# Patient Record
Sex: Male | Born: 1965 | Race: White | Hispanic: No | Marital: Married | Smoking: Never smoker
Health system: Southern US, Community
[De-identification: ages and names within clinical notes are randomized; demographics above are authoritative.]

## PROBLEM LIST (undated history)

## (undated) DIAGNOSIS — M1990 Unspecified osteoarthritis, unspecified site: Secondary | ICD-10-CM

## (undated) DIAGNOSIS — J45909 Unspecified asthma, uncomplicated: Secondary | ICD-10-CM

## (undated) DIAGNOSIS — N200 Calculus of kidney: Secondary | ICD-10-CM

## (undated) HISTORY — DX: Unspecified osteoarthritis, unspecified site: M19.90

## (undated) HISTORY — DX: Unspecified asthma, uncomplicated: J45.909

## (undated) HISTORY — DX: Calculus of kidney: N20.0

## (undated) HISTORY — PX: NERVE BIOPSY: SHX1015

---

## 2011-03-05 ENCOUNTER — Ambulatory Visit: Payer: Self-pay | Admitting: Internal Medicine

## 2013-07-03 ENCOUNTER — Ambulatory Visit: Payer: Self-pay | Admitting: Cardiovascular Disease

## 2013-07-18 ENCOUNTER — Encounter: Payer: Self-pay | Admitting: *Deleted

## 2013-07-19 ENCOUNTER — Ambulatory Visit (INDEPENDENT_AMBULATORY_CARE_PROVIDER_SITE_OTHER): Payer: BC Managed Care – PPO | Admitting: Cardiovascular Disease

## 2013-07-19 ENCOUNTER — Encounter: Payer: Self-pay | Admitting: Cardiovascular Disease

## 2013-07-19 ENCOUNTER — Ambulatory Visit: Payer: Self-pay | Admitting: Cardiovascular Disease

## 2013-07-19 VITALS — BP 129/86 | HR 96 | Ht 71.0 in | Wt 250.0 lb

## 2013-07-19 VITALS — BP 123/86 | HR 80 | Ht 71.5 in | Wt 250.8 lb

## 2013-07-19 DIAGNOSIS — R079 Chest pain, unspecified: Secondary | ICD-10-CM

## 2013-07-19 NOTE — Patient Instructions (Signed)
Your physician has requested that you have an exercise tolerance test. For further information please visit [email or web address]. Please also follow instruction sheet, as given.   

## 2013-07-19 NOTE — Procedures (Signed)
   Treadmill Stress test  Indication: Chest pain and palpitations  Baseline Data:  Resting EKG shows NSR with rate of 96 bpm, no significant ST or T wave changes Resting blood pressure of 129/86 mm Hg Stand bruce protocal was used.  Exercise Data:  Patient exercised for 6 min 11 sec,  Peak heart rate of 162 bpm.  This was 93 % of the maximum predicted heart rate. No symptoms of chest pain or lightheadedness were reported at peak stress or in recovery.  Peak Blood pressure recorded was 175/72 Maximal work level: 7.2 METs.  Heart rate at 3 minutes in recovery was 110 bpm. BP response: Normal HR response: Normal  EKG with Exercise: Sinus tachycardia with no significant ST changes  FINAL IMPRESSION: Normal exercise stress test. No significant EKG changes concerning for ischemia. Average exercise tolerance.

## 2013-07-19 NOTE — Patient Instructions (Signed)
Your stress test is normal.  Follow up as needed.  

## 2013-07-20 DIAGNOSIS — R079 Chest pain, unspecified: Secondary | ICD-10-CM | POA: Insufficient documentation

## 2013-07-20 NOTE — Progress Notes (Signed)
  Primary care physician: Reather LittlerLeslie Sharp FNP  HPI  This is a pleasant 48 year old male who was referred for evaluation of chest pain and palpitations. He has no previous cardiac history. He has no significant risk factors for coronary artery disease other than obesity. There is no family history of premature coronary artery disease. He does not smoke. He was under pressure recently related to being busy at work as well as being in graduate school. He was consuming excessive amounts of caffeine daily estimated between 1500-2000 mg every day. Recently while at work, had an episode of palpitations associated with substernal chest tightness. He was mildly tachycardic. EKG shows sinus tachycardia with a heart rate of 100 or 3 beats per minute. He gradually cut down on caffeine intake since then with his occlusion of symptoms.  Allergies  Allergen Reactions  . Penicillins      No current outpatient prescriptions on file prior to visit.   No current facility-administered medications on file prior to visit.     Past Medical History  Diagnosis Date  . Kidney stone   . Asthma   . OA (osteoarthritis)      Past Surgical History  Procedure Laterality Date  . Nerve biopsy       Family History  Problem Relation Age of Onset  . Cancer Father      History   Social History  . Marital Status: Married    Spouse Name: N/A    Number of Children: N/A  . Years of Education: N/A   Occupational History  . Not on file.   Social History Main Topics  . Smoking status: Never Smoker   . Smokeless tobacco: Not on file  . Alcohol Use: No  . Drug Use: No  . Sexual Activity: Not on file   Other Topics Concern  . Not on file   Social History Narrative  . No narrative on file     ROS A 10 point review of system was performed. It is negative other than that mentioned in the history of present illness.   PHYSICAL EXAM   BP 123/86  Pulse 80  Ht 5' 11.5" (1.816 m)  Wt 250 lb 12 oz  (113.739 kg)  BMI 34.49 kg/m2 Constitutional: He is oriented to person, place, and time. He appears well-developed and well-nourished. No distress.  HENT: No nasal discharge.  Head: Normocephalic and atraumatic.  Eyes: Pupils are equal and round.  No discharge. Neck: Normal range of motion. Neck supple. No JVD present. No thyromegaly present.  Cardiovascular: Normal rate, regular rhythm, normal heart sounds. Exam reveals no gallop and no friction rub. No murmur heard.  Pulmonary/Chest: Effort normal and breath sounds normal. No stridor. No respiratory distress. He has no wheezes. He has no rales. He exhibits no tenderness.  Abdominal: Soft. Bowel sounds are normal. He exhibits no distension. There is no tenderness. There is no rebound and no guarding.  Musculoskeletal: Normal range of motion. He exhibits no edema and no tenderness.  Neurological: He is alert and oriented to person, place, and time. Coordination normal.  Skin: Skin is warm and dry. No rash noted. He is not diaphoretic. No erythema. No pallor.  Psychiatric: He has a normal mood and affect. His behavior is normal. Judgment and thought content normal.       WUJ:[id number]:Sinus  Rhythm  WITHIN NORMAL LIMITS   ASSESSMENT AND PLAN

## 2013-07-20 NOTE — Assessment & Plan Note (Signed)
I suspect that symptoms of palpitations and chest tightness was related to excessive caffeine intake. Physical exam is unremarkable and baseline ECG is normal. I proceeded with a treadmill stress test which showed no evidence of ischemia. He reports no further symptoms after decreasing caffeine intake. He can followup with us as needed. I discussed with him the importance of healthy lifestyle, regular exercise and weight loss.

## 2013-07-23 ENCOUNTER — Encounter: Payer: Self-pay | Admitting: *Deleted

## 2016-04-02 ENCOUNTER — Ambulatory Visit: Payer: BC Managed Care – PPO | Admitting: Family Medicine

## 2016-04-06 ENCOUNTER — Encounter: Payer: Self-pay | Admitting: Emergency Medicine

## 2016-04-06 ENCOUNTER — Emergency Department: Payer: BC Managed Care – PPO

## 2016-04-06 ENCOUNTER — Emergency Department
Admission: EM | Admit: 2016-04-06 | Discharge: 2016-04-06 | Disposition: A | Discharge status: Home / Self Care | Payer: BC Managed Care – PPO | Attending: Emergency Medicine | Admitting: Emergency Medicine

## 2016-04-06 DIAGNOSIS — X58XXXA Exposure to other specified factors, initial encounter: Secondary | ICD-10-CM | POA: Insufficient documentation

## 2016-04-06 DIAGNOSIS — Z79899 Other long term (current) drug therapy: Secondary | ICD-10-CM | POA: Diagnosis not present

## 2016-04-06 DIAGNOSIS — J45909 Unspecified asthma, uncomplicated: Secondary | ICD-10-CM | POA: Insufficient documentation

## 2016-04-06 DIAGNOSIS — Y999 Unspecified external cause status: Secondary | ICD-10-CM | POA: Diagnosis not present

## 2016-04-06 DIAGNOSIS — Y92811 Bus as the place of occurrence of the external cause: Secondary | ICD-10-CM | POA: Diagnosis not present

## 2016-04-06 DIAGNOSIS — S299XXA Unspecified injury of thorax, initial encounter: Secondary | ICD-10-CM | POA: Diagnosis present

## 2016-04-06 DIAGNOSIS — Y939 Activity, unspecified: Secondary | ICD-10-CM | POA: Insufficient documentation

## 2016-04-06 DIAGNOSIS — R0781 Pleurodynia: Secondary | ICD-10-CM | POA: Diagnosis not present

## 2016-04-06 DIAGNOSIS — Z7982 Long term (current) use of aspirin: Secondary | ICD-10-CM | POA: Insufficient documentation
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
## 2016-04-06 NOTE — ED Notes (Signed)
See triage note. Pt c/o L mid back pain radiating towards ant L rib cage. Has been taking aleve with some relief. Exacerbated by certain movements and deep breathing. Denies SOB, denies urinary s/sx.
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
## 2016-04-27 ENCOUNTER — Encounter: Payer: Self-pay | Admitting: Family Medicine

## 2016-04-27 ENCOUNTER — Ambulatory Visit (INDEPENDENT_AMBULATORY_CARE_PROVIDER_SITE_OTHER): Payer: BC Managed Care – PPO | Admitting: Family Medicine

## 2016-04-27 VITALS — BP 122/88 | HR 92 | Temp 98.0°F | Ht 69.0 in | Wt 259.4 lb

## 2016-04-27 DIAGNOSIS — E669 Obesity, unspecified: Secondary | ICD-10-CM | POA: Insufficient documentation

## 2016-04-27 DIAGNOSIS — Z0000 Encounter for general adult medical examination without abnormal findings: Secondary | ICD-10-CM | POA: Diagnosis not present

## 2016-04-27 DIAGNOSIS — M1990 Unspecified osteoarthritis, unspecified site: Secondary | ICD-10-CM | POA: Insufficient documentation

## 2016-04-27 DIAGNOSIS — J45909 Unspecified asthma, uncomplicated: Secondary | ICD-10-CM | POA: Insufficient documentation

## 2016-04-27 NOTE — Patient Instructions (Signed)
Follow up annually or sooner if needed.  Take care  Dr. Adriana Simas    Health Maintenance, Male A healthy lifestyle and preventive care is important for your health and wellness. Ask your health care provider about what schedule of regular examinations is right for you. What should I know about weight and diet?  Eat a Healthy Diet  Eat plenty of vegetables, fruits, whole grains, low-fat dairy products, and lean protein.  Do not eat a lot of foods high in solid fats, added sugars, or salt. Maintain a Healthy Weight  Regular exercise can help you achieve or maintain a healthy weight. You should:  Do at least 150 minutes of exercise each week. The exercise should increase your heart rate and make you sweat (moderate-intensity exercise).  Do strength-training exercises at least twice a week. Watch Your Levels of Cholesterol and Blood Lipids  Have your blood tested for lipids and cholesterol every 5 years starting at 50 years of age. If you are at high risk for heart disease, you should start having your blood tested when you are 50 years old. You may need to have your cholesterol levels checked more often if:  Your lipid or cholesterol levels are high.  You are older than 50 years of age.  You are at high risk for heart disease. What should I know about cancer screening? Many types of cancers can be detected early and may often be prevented. Lung Cancer  You should be screened every year for lung cancer if:  You are a current smoker who has smoked for at least 30 years.  You are a former smoker who has quit within the past 15 years.  Talk to your health care provider about your screening options, when you should start screening, and how often you should be screened. Colorectal Cancer  Routine colorectal cancer screening usually begins at 50 years of age and should be repeated every 5-10 years until you are 50 years old. You may need to be screened more often if early forms of  precancerous polyps or small growths are found. Your health care provider may recommend screening at an earlier age if you have risk factors for colon cancer.  Your health care provider may recommend using home test kits to check for hidden blood in the stool.  A small camera at the end of a tube can be used to examine your colon (sigmoidoscopy or colonoscopy). This checks for the earliest forms of colorectal cancer. Prostate and Testicular Cancer  Depending on your age and overall health, your health care provider may do certain tests to screen for prostate and testicular cancer.  Talk to your health care provider about any symptoms or concerns you have about testicular or prostate cancer. Skin Cancer  Check your skin from head to toe regularly.  Tell your health care provider about any new moles or changes in moles, especially if:  There is a change in a mole's size, shape, or color.  You have a mole that is larger than a pencil eraser.  Always use sunscreen. Apply sunscreen liberally and repeat throughout the day.  Protect yourself by wearing long sleeves, pants, a wide-brimmed hat, and sunglasses when outside. What should I know about heart disease, diabetes, and high blood pressure?  If you are 30-64 years of age, have your blood pressure checked every 3-5 years. If you are 54 years of age or older, have your blood pressure checked every year. You should have your blood pressure measured twice-once  when you are at a hospital or clinic, and once when you are not at a hospital or clinic. Record the average of the two measurements. To check your blood pressure when you are not at a hospital or clinic, you can use:  An automated blood pressure machine at a pharmacy.  A home blood pressure monitor.  Talk to your health care provider about your target blood pressure.  If you are between 10-29 years old, ask your health care provider if you should take aspirin to prevent heart  disease.  Have regular diabetes screenings by checking your fasting blood sugar level.  If you are at a normal weight and have a low risk for diabetes, have this test once every three years after the age of 45.  If you are overweight and have a high risk for diabetes, consider being tested at a younger age or more often.  A one-time screening for abdominal aortic aneurysm (AAA) by ultrasound is recommended for men aged 58-75 years who are current or former smokers. What should I know about preventing infection? Hepatitis B  If you have a higher risk for hepatitis B, you should be screened for this virus. Talk with your health care provider to find out if you are at risk for hepatitis B infection. Hepatitis C  Blood testing is recommended for:  Everyone born from [birth year] through 1964.  Anyone with known risk factors for hepatitis C. Sexually Transmitted Diseases (STDs)  You should be screened each year for STDs including gonorrhea and chlamydia if:  You are sexually active and are younger than 50 years of age.  You are older than 50 years of age and your health care provider tells you that you are at risk for this type of infection.  Your sexual activity has changed since you were last screened and you are at an increased risk for chlamydia or gonorrhea. Ask your health care provider if you are at risk.  Talk with your health care provider about whether you are at high risk of being infected with HIV. Your health care provider may recommend a prescription medicine to help prevent HIV infection. What else can I do?  Schedule regular health, dental, and eye exams.  Stay current with your vaccines (immunizations).  Do not use any tobacco products, such as cigarettes, chewing tobacco, and e-cigarettes. If you need help quitting, ask your health care provider.  Limit alcohol intake to no more than 2 drinks per day. One drink equals 12 ounces of beer, 5 ounces of wine, or 1 ounces of hard  liquor.  Do not use street drugs.  Do not share needles.  Ask your health care provider for help if you need support or information about quitting drugs.  Tell your health care provider if you often feel depressed.  Tell your health care provider if you have ever been abused or do not feel safe at home. This information is not intended to replace advice given to you by your health care provider. Make sure you discuss any questions you have with your health care provider. Document Released: 07/10/2007 Document Revised: 09/10/2015 Document Reviewed: 10/15/2014 Elsevier Interactive Patient Education  2017 [employer].

## 2016-04-27 NOTE — Progress Notes (Signed)
   Subjective:  Patient ID: Joe Adkins, male    DOB: March 05, 1965  Age: 51 y.o. MRN: [id number]  CC: Annual physical exam/establish care  HPI Joe Adkins is a 51 y.o. male presents to the clinic today to establish care. He is in need of an annual physical exam.  Preventative Healthcare  Colonoscopy: Not needed yet.  Immunizations  Tetanus - Unsure. Declines today.   Flu - Up to date.   Labs: Screening labs today.  Alcohol use: No.  Smoking/tobacco use: No  STD/HIV testing: Declines.  Regular dental exams: Yes.  Wears seat belt: Yes.   PMH, Surgical Hx, Family Hx, Social History reviewed and updated as below.  Past Medical History:  Diagnosis Date  . Asthma   . Kidney stone   . OA (osteoarthritis)    Past Surgical History:  Procedure Laterality Date  . NERVE BIOPSY     Family History  Problem Relation Age of Onset  . Arthritis Mother   . Lung cancer Father   . Arthritis Maternal Grandmother    Social History  Substance Use Topics  . Smoking status: Never Smoker  . Smokeless tobacco: Never Used  . Alcohol use No    Review of Systems  Musculoskeletal:       Joint pain.  All other systems reviewed and are negative.   Objective:   Today's Vitals: BP 122/88   Pulse 92   Temp 98 F (36.7 C) (Oral)   Ht  (1.753 m)   Wt 259 lb 6 oz (117.7 kg)   SpO2 97%   BMI 38.30 kg/m   Physical Exam  Constitutional: He is oriented to person, place, and time. He appears well-developed and well-nourished. No distress.  HENT:  Head: Normocephalic and atraumatic.  Nose: Nose normal.  Mouth/Throat: Oropharynx is clear and moist. No oropharyngeal exudate.  Normal TM's bilaterally.   Eyes: Conjunctivae are normal. No scleral icterus.  Neck: Neck supple.  Cardiovascular: Normal rate and regular rhythm.   No murmur heard. Pulmonary/Chest: Effort normal and breath sounds normal. He has no wheezes. He has no rales.  Abdominal: Soft. He exhibits no distension.  There is no tenderness. There is no rebound and no guarding.  Musculoskeletal: Normal range of motion. He exhibits no edema.  Lymphadenopathy:    He has no cervical adenopathy.  Neurological: He is alert and oriented to person, place, and time.  Skin: Skin is warm and dry. No rash noted.  Psychiatric: He has a normal mood and affect.  Vitals reviewed.  Assessment & Plan:   Problem List Items Addressed This Visit    Annual physical exam - Primary    Declined HIV screening. Declines tetanus vaccination. Screening labs today. Flu up-to-date.      Relevant Orders   CBC   Hemoglobin A1c   Comprehensive metabolic panel   Lipid panel     Follow-up: Annually  Everlene Other DO [facility]

## 2016-04-27 NOTE — Assessment & Plan Note (Signed)
Declined HIV screening. Declines tetanus vaccination. Screening labs today. Flu up-to-date.

## 2016-04-27 NOTE — Progress Notes (Signed)
Pre visit review using our clinic review tool, if applicable. No additional management support is needed unless otherwise documented below in the visit note. 

## 2016-04-28 LAB — LIPID PANEL
CHOLESTEROL: 209 mg/dL — AB (ref 0–200)
HDL: 52.9 mg/dL (ref >39.00)
LDL Cholesterol: 125 mg/dL — ABNORMAL HIGH (ref 0–99)
NONHDL: 156.24
Total CHOL/HDL Ratio: 4
Triglycerides: 155 mg/dL — ABNORMAL HIGH (ref 0.0–149.0)
VLDL: 31 mg/dL (ref 0.0–40.0)

## 2016-04-28 LAB — COMPREHENSIVE METABOLIC PANEL
ALBUMIN: 4.2 g/dL (ref 3.5–5.2)
ALK PHOS: 84 U/L (ref 39–117)
ALT: 24 U/L (ref 0–53)
AST: 23 U/L (ref 0–37)
BILIRUBIN TOTAL: 0.3 mg/dL (ref 0.2–1.2)
BUN: 9 mg/dL (ref 6–23)
CO2: 29 mEq/L (ref 19–32)
CREATININE: 0.89 mg/dL (ref 0.40–1.50)
Calcium: 9.5 mg/dL (ref 8.4–10.5)
Chloride: 103 mEq/L (ref 96–112)
GFR: 96.4 mL/min (ref >60.00)
Glucose, Bld: 95 mg/dL (ref 70–99)
Potassium: 4.1 mEq/L (ref 3.5–5.1)
Sodium: 140 mEq/L (ref 135–145)
TOTAL PROTEIN: 7.5 g/dL (ref 6.0–8.3)

## 2016-04-28 LAB — HEMOGLOBIN A1C: HEMOGLOBIN A1C: 5.6 % (ref 4.6–6.5)

## 2016-04-28 LAB — CBC
HEMATOCRIT: 46.9 % (ref 39.0–52.0)
HEMOGLOBIN: 15.8 g/dL (ref 13.0–17.0)
MCHC: 33.6 g/dL (ref 30.0–36.0)
MCV: 90.9 fl (ref 78.0–100.0)
PLATELETS: 357 10*3/uL (ref 150.0–400.0)
RBC: 5.16 Mil/uL (ref 4.22–5.81)
RDW: 14.2 % (ref 11.5–15.5)
WBC: 11 10*3/uL — ABNORMAL HIGH (ref 4.0–10.5)

## 2016-07-05 ENCOUNTER — Encounter: Payer: Self-pay | Admitting: Family Medicine

## 2017-03-07 ENCOUNTER — Encounter: Payer: Self-pay | Admitting: Family Medicine

## 2017-03-08 ENCOUNTER — Encounter: Payer: Self-pay | Admitting: Primary Care

## 2017-03-08 ENCOUNTER — Ambulatory Visit: Payer: BC Managed Care – PPO | Admitting: Primary Care

## 2017-03-08 VITALS — BP 126/82 | HR 107 | Temp 98.3°F | Ht 69.0 in | Wt 262.8 lb

## 2017-03-08 DIAGNOSIS — J019 Acute sinusitis, unspecified: Secondary | ICD-10-CM | POA: Diagnosis not present

## 2017-03-08 DIAGNOSIS — J4520 Mild intermittent asthma, uncomplicated: Secondary | ICD-10-CM | POA: Diagnosis not present

## 2017-03-08 MED ORDER — AZITHROMYCIN 250 MG PO TABS: ORAL_TABLET | ORAL | 0 refills | Status: DC

## 2017-03-08 NOTE — Patient Instructions (Signed)
Continue the antihistamine.  Talk with your new PCP about a controller medication for asthma.  Consider using Nasonex or Nasacort for sinus pressure and nasal congestion.  Start Azithromycin antibiotics for infection. Take 2 tablets by mouth today, then 1 tablet daily for 4 additional days.  It was a pleasure meeting you!

## 2017-03-08 NOTE — Assessment & Plan Note (Signed)
No use of Advair since August 2018, offered a refill for which he refuses. Offered to try Symbicort, he continues to refuse. No wheezing on exam. Continue antihistamine.

## 2017-03-08 NOTE — Progress Notes (Signed)
Subjective:    Patient ID: Joe Adkins, male    DOB: 03/29/1965, 52 y.o.   MRN: [id number]  HPI  Joe Adkins is a 52 year old male with a history of asthma, obesity who presents today with a chief complaint of sinus pressure.  He also reports headache, nasal congestion, chest pain, cough, post nasal drip. He's had these symptoms intermittently since Thanksgiving 2018, never any treatment. His symptoms progressed Thursday last week with sinus pressure. He's expelling white to green sputum from his nasal cavity.  He denies wheezing, shortness of breath, fevers.   He's been taking Aleve, an antihistamine, and albuterol inhaler without much improvement. He's not used his albuterol inhaler today, did have a large mountain dew this morning. He had his flu shot this year. He's not had his Advair inhaler since August 2018 as he doesn't believe it's every worked. He's not tried any other controller inhaler.   Review of Systems  Constitutional: Negative for chills and fever.  HENT: Positive for congestion, postnasal drip and sinus pressure. Negative for sore throat.   Respiratory: Positive for cough. Negative for shortness of breath and wheezing.   Cardiovascular: Negative for chest pain.       Past Medical History:  Diagnosis Date  . Asthma   . Kidney stone   . OA (osteoarthritis)      Social History   Socioeconomic History  . Marital status: Married    Spouse name: Not on file  . Number of children: Not on file  . Years of education: Not on file  . Highest education level: Not on file  Social Needs  . Financial resource strain: Not on file  . Food insecurity - worry: Not on file  . Food insecurity - inability: Not on file  . Transportation needs - medical: Not on file  . Transportation needs - non-medical: Not on file  Occupational History  . Not on file  Tobacco Use  . Smoking status: Never Smoker  . Smokeless tobacco: Never Used  Substance and Sexual Activity  . Alcohol  use: No  . Drug use: No  . Sexual activity: Yes    Partners: Female  Other Topics Concern  . Not on file  Social History Narrative   ** Merged History Encounter **        Past Surgical History:  Procedure Laterality Date  . NERVE BIOPSY      Family History  Problem Relation Age of Onset  . Arthritis Mother   . Lung cancer Father   . Arthritis Maternal Grandmother     Allergies  Allergen Reactions  . Bee Venom Anaphylaxis  . Penicillins Anaphylaxis    Current Outpatient Medications on File Prior to Visit  Medication Sig Dispense Refill  . acetaminophen (TYLENOL) 325 MG tablet Take 650 mg by mouth as needed.    . Fluticasone-Salmeterol (ADVAIR) 250-50 MCG/DOSE AEPB Inhale 1 puff into the lungs 2 (two) times daily.    . naproxen sodium (ANAPROX) 220 MG tablet Take 220 mg by mouth 2 (two) times daily with a meal.     No current facility-administered medications on file prior to visit.     BP 126/82   Pulse (!) 107   Temp 98.3 F (36.8 C) (Oral)   Ht 5\' 9"  (1.753 m)   Wt 262 lb 12.8 oz (119.2 kg)   SpO2 98%   BMI 38.81 kg/m    Objective:   Physical Exam  Constitutional: He appears well-nourished.  HENT:  Right Ear: Tympanic membrane and ear canal normal.  Left Ear: Tympanic membrane and ear canal normal.  Nose: Mucosal edema present. Right sinus exhibits maxillary sinus tenderness. Right sinus exhibits no frontal sinus tenderness. Left sinus exhibits maxillary sinus tenderness. Left sinus exhibits no frontal sinus tenderness.  Mouth/Throat: Oropharynx is clear and moist.  Eyes: Conjunctivae are normal.  Neck: Neck supple.  Cardiovascular: Normal rate and regular rhythm.  Pulmonary/Chest: Effort normal and breath sounds normal. He has no wheezes. He has no rales.  Dry cough during exam  Skin: Skin is warm and dry.          Assessment & Plan:  Sinusitis:  Allergy and asthma symptoms since Thanksgiving 2018. Increased sinus pressure for the past 5  days, no fevers. He is mildly tachycardic in the office today, did have a mountain dew. Rapid flu: Negative Do recommend controller inhaler for dry cough that was persistent on exam, he will discuss this with his new PCP. Treat for presumed sinusitis with Zpak, PCN allergy. Continue antihistamine, recommended Flonase for which he refused. Follow up PRN.  Doreene Nest, NP

## 2017-04-06 ENCOUNTER — Ambulatory Visit: Payer: BC Managed Care – PPO | Admitting: Primary Care

## 2017-04-06 ENCOUNTER — Encounter: Payer: Self-pay | Admitting: Primary Care

## 2017-04-06 VITALS — BP 122/82 | HR 102 | Temp 98.1°F | Ht 69.0 in | Wt 262.4 lb

## 2017-04-06 DIAGNOSIS — J4540 Moderate persistent asthma, uncomplicated: Secondary | ICD-10-CM | POA: Diagnosis not present

## 2017-04-06 MED ORDER — FLUTICASONE-SALMETEROL 250-50 MCG/DOSE IN AEPB: 1.0000 | INHALATION_SPRAY | Freq: Two times a day (BID) | RESPIRATORY_TRACT | 5 refills | Status: DC

## 2017-04-06 MED ORDER — ALBUTEROL SULFATE HFA 108 (90 BASE) MCG/ACT IN AERS: 2.0000 | INHALATION_SPRAY | RESPIRATORY_TRACT | 0 refills | Status: AC | PRN

## 2017-04-06 NOTE — Progress Notes (Signed)
Subjective:    Patient ID: Joe DemarkFred Adkins, male    DOB: 08/03/1965, 52 y.o.   MRN: [id number]  HPI  Joe Adkins is a 52 year old male who [facility] today [facility].  1) Asthma: Diagnosed during his teenage years. Once prescribed Advair 250/50 mcg/dose and Ventolin inhaler. He's not one to take medication daily, so he used his Advair for 2 weeks, then stopped. Over the last 4-5 months he's noticed a persistent, dry cough. He will experience intermittent post nasal drip, congestion, shortness of breath, chest pressure during seasonal changes.  He was seen in February 2019 for acute sinusitis, also endorsed persistent dry cough since November. He was treated for sinusitis and encouraged to resume his maintenance inhaler for which he declined.   Today he's open to restarting his Adviar as his cough persists. He does experience infreqent GERD symptoms including esophageal burning. He denies recent wheezing, fevers, chills.   Review of Systems  Constitutional: Negative for chills and fever.  Respiratory: Positive for cough and shortness of breath. Negative for chest tightness and wheezing.   Cardiovascular: Negative for chest pain.       Past Medical History:  Diagnosis Date  . Asthma   . Kidney stone   . OA (osteoarthritis)      Social History   Socioeconomic History  . Marital status: Married    Spouse name: Not on file  . Number of children: Not on file  . Years of education: Not on file  . Highest education level: Not on file  Social Needs  . Financial resource strain: Not on file  . Food insecurity - worry: Not on file  . Food insecurity - inability: Not on file  . Transportation needs - medical: Not on file  . Transportation needs - non-medical: Not on file  Occupational History  . Not on file  Tobacco Use  . Smoking status: Never Smoker  . Smokeless tobacco: Never Used  Substance and Sexual Activity  . Alcohol use: No  . Drug use: No  .  Sexual activity: Yes    Partners: Female  Other Topics Concern  . Not on file  Social History Narrative   ** Merged History Encounter **        Past Surgical History:  Procedure Laterality Date  . NERVE BIOPSY      Family History  Problem Relation Age of Onset  . Arthritis Mother   . Lung cancer Father   . Arthritis Maternal Grandmother     Allergies  Allergen Reactions  . Bee Venom Anaphylaxis  . Penicillins Anaphylaxis    Current Outpatient Medications on File Prior to Visit  Medication Sig Dispense Refill  . naproxen sodium (ANAPROX) 220 MG tablet Take 220 mg by mouth 2 (two) times daily with a meal.     No current facility-administered medications on file prior to visit.     BP 122/82   Pulse (!) 102   Temp 98.1 F (36.7 C) (Oral)   Ht 5\' 9"  (1.753 m)   Wt 262 lb 6.4 oz (119 kg)   SpO2 96%   BMI 38.75 kg/m    Objective:   Physical Exam  Constitutional: He appears well-nourished.  HENT:  Right Ear: Tympanic membrane and ear canal normal.  Left Ear: Tympanic membrane and ear canal normal.  Nose: No mucosal edema. Right sinus exhibits no maxillary sinus tenderness and no frontal sinus tenderness. Left sinus exhibits no maxillary sinus tenderness and  no frontal sinus tenderness.  Mouth/Throat: Oropharynx is clear and moist.  Eyes: Conjunctivae are normal.  Neck: Neck supple.  Cardiovascular: Normal rate and regular rhythm.  Pulmonary/Chest: Effort normal and breath sounds normal. He has no wheezes. He has no rales.  Dry cough ever 30-60 seconds.  Skin: Skin is warm and dry.          Assessment & Plan:

## 2017-04-06 NOTE — Patient Instructions (Signed)
Resume your Advair inhaler. Inhale 1 puff into the lungs twice daily.   Use the albuterol inhaler as needed for breakthrough shortness of breath, wheezing cough. Inhale 2 puffs into the lungs every 4 to 6 hours as needed.   Please update me in 2-3 weeks if no improvement.   Please schedule a physical with me this Summer. You may also schedule a lab only appointment 3-4 days prior. We will discuss your lab results in detail during your physical.  It was a pleasure to see you today! Please don't hesitate to call or message me with any questions. [facility]!

## 2017-04-06 NOTE — Assessment & Plan Note (Signed)
Persistent dry cough since November 2018.  He is now agreeable to resuming daily maintenance inhaler. Refills for Advair and Ventolin inhalers provided.  He will update in 2-3 weeks if symptoms persist. Consider GERD treatment.

## 2017-04-09 ENCOUNTER — Encounter: Payer: Self-pay | Admitting: Primary Care

## 2017-04-10 ENCOUNTER — Encounter: Payer: Self-pay | Admitting: Primary Care

## 2017-04-11 ENCOUNTER — Encounter: Payer: Self-pay | Admitting: Primary Care

## 2018-02-24 IMAGING — CR DG CHEST 2V
2 series · 2 of 2 positions shown · non-contrast
Comparison: None in PACs

CLINICAL DATA: Left chest wall pain after falling onto a chair 2
weeks ago. Symptoms are worsening. History of asthma. Nonsmoker.

EXAM:
CHEST  2 VIEW

[chest pa]
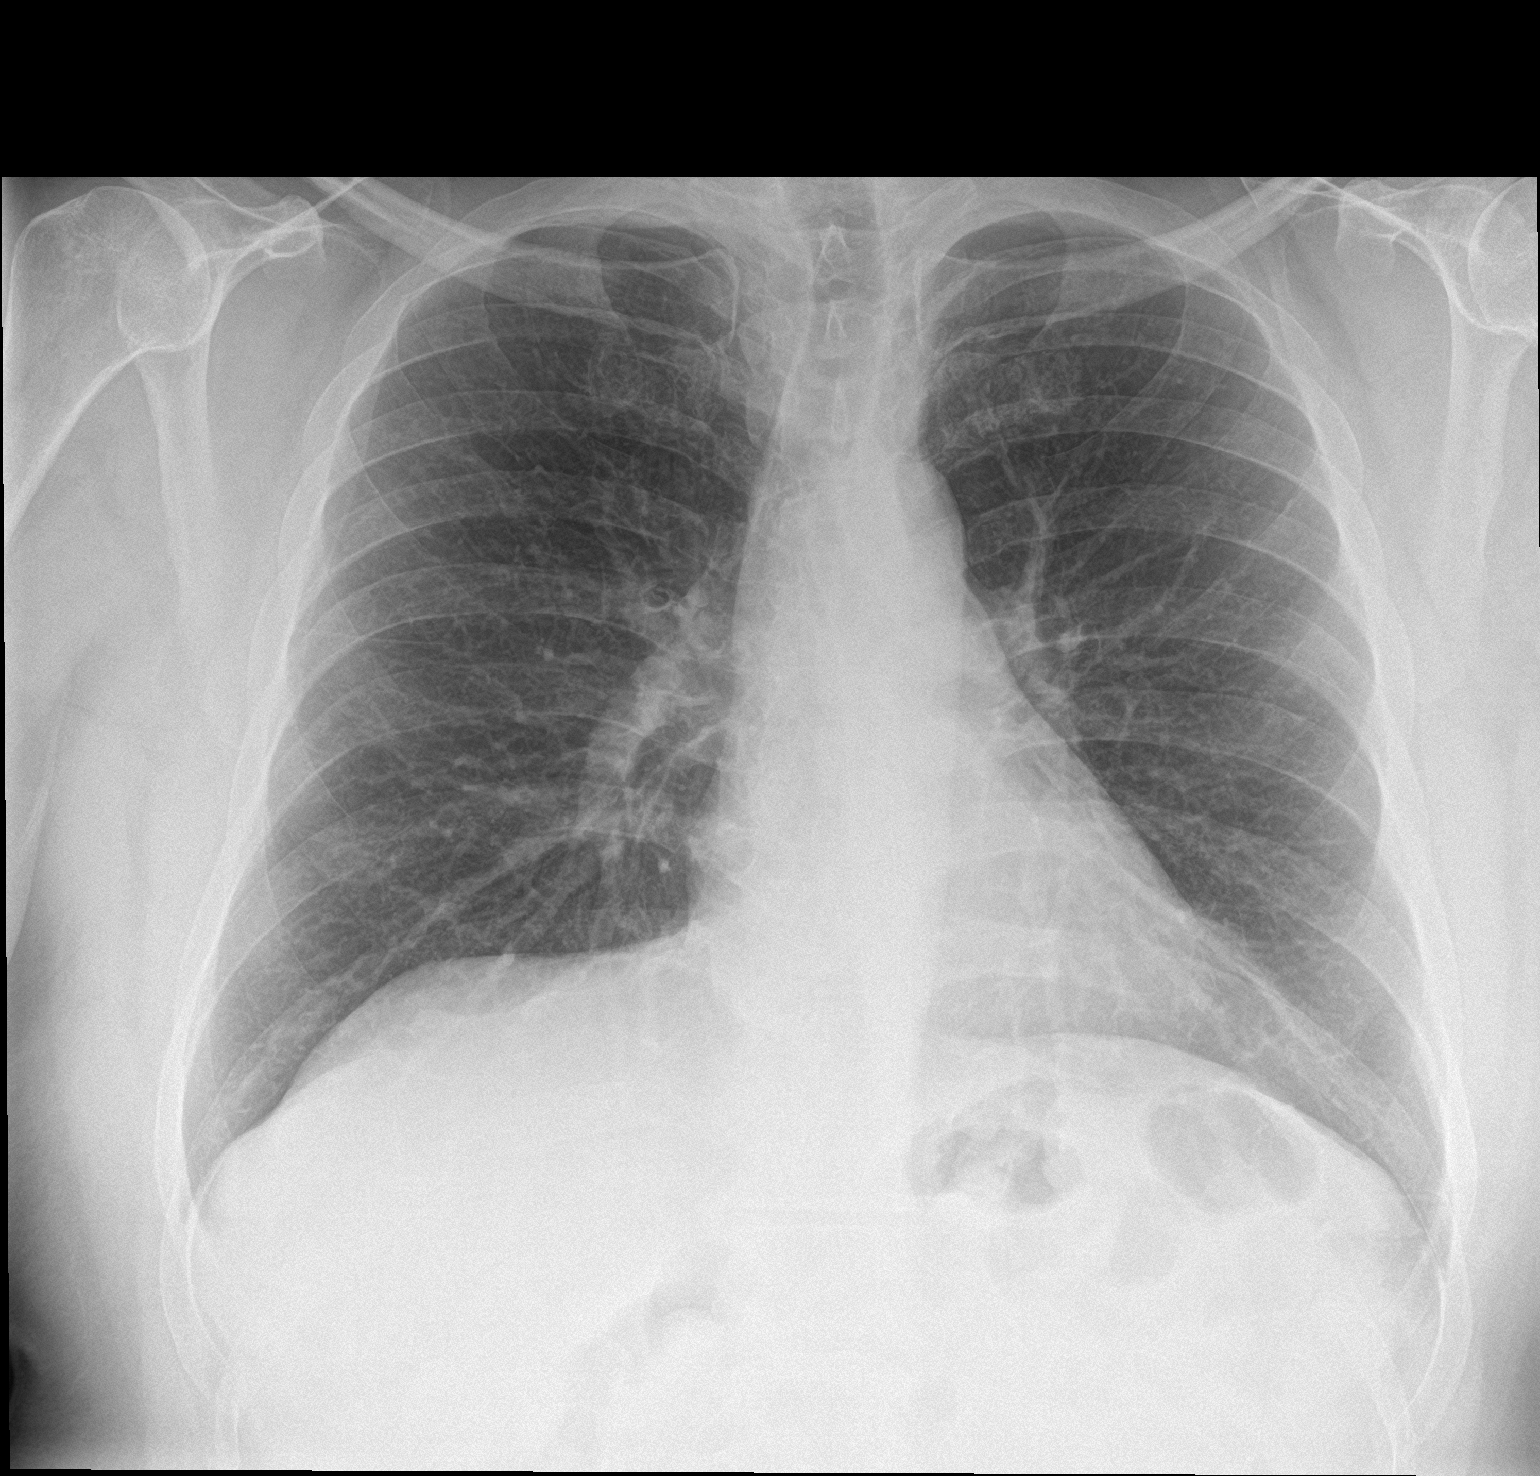

[chest lat]
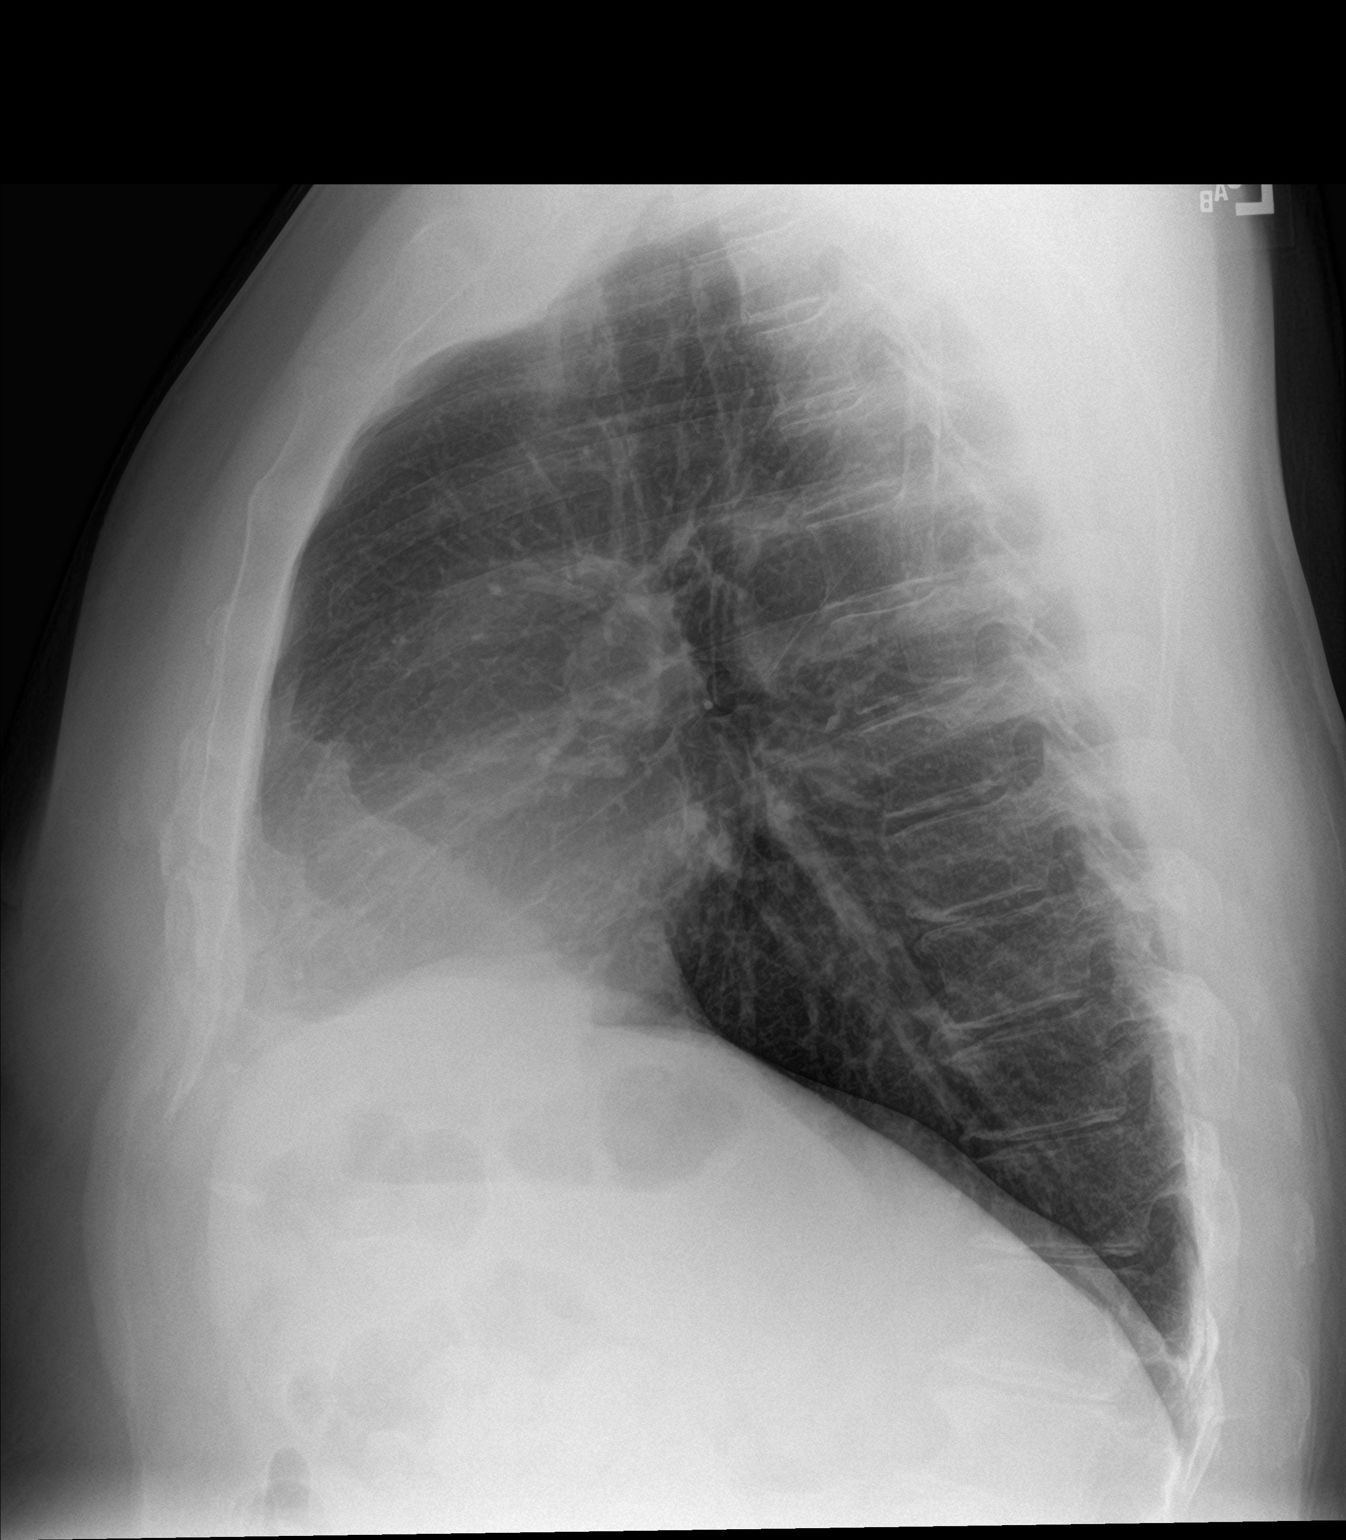

[2 of 2 positions shown; findings below may reference images not displayed]

FINDINGS: The lungs are well-expanded and clear. There is no pleural effusion,
pneumothorax, or pneumomediastinum. The heart and pulmonary
vascularity are normal. The mediastinum is normal in width. The bony
thorax exhibits no acute abnormality.
IMPRESSION: No evidence of a pulmonary contusion nor other acute cardiopulmonary
abnormality.

## 2021-06-29 ENCOUNTER — Other Ambulatory Visit: Payer: Self-pay

## 2021-06-29 ENCOUNTER — Emergency Department: Payer: BC Managed Care – PPO

## 2021-06-29 ENCOUNTER — Emergency Department
Admission: EM | Admit: 2021-06-29 | Discharge: 2021-06-29 | Disposition: A | Discharge status: Home / Self Care | Payer: BC Managed Care – PPO | Attending: Student in an Organized Health Care Education/Training Program | Admitting: Student in an Organized Health Care Education/Training Program

## 2021-06-29 DIAGNOSIS — D72829 Elevated white blood cell count, unspecified: Secondary | ICD-10-CM | POA: Diagnosis not present

## 2021-06-29 DIAGNOSIS — R109 Unspecified abdominal pain: Secondary | ICD-10-CM | POA: Insufficient documentation

## 2021-06-29 LAB — URINALYSIS, ROUTINE W REFLEX MICROSCOPIC
Bilirubin Urine: NEGATIVE
Glucose, UA: NEGATIVE mg/dL
Ketones, ur: NEGATIVE mg/dL
Leukocytes,Ua: NEGATIVE
Nitrite: NEGATIVE
Protein, ur: NEGATIVE mg/dL
Specific Gravity, Urine: 1.014 (ref 1.005–1.030)
pH: 5 (ref 5.0–8.0)

## 2021-06-29 LAB — CBC
HCT: 45.2 % (ref 39.0–52.0)
Hemoglobin: 15.4 g/dL (ref 13.0–17.0)
MCH: 30.6 pg (ref 26.0–34.0)
MCHC: 34.1 g/dL (ref 30.0–36.0)
MCV: 89.7 fL (ref 80.0–100.0)
Platelets: 354 10*3/uL (ref 150–400)
RBC: 5.04 MIL/uL (ref 4.22–5.81)
RDW: 13.2 % (ref 11.5–15.5)
WBC: 11.3 10*3/uL — ABNORMAL HIGH (ref 4.0–10.5)
nRBC: 0 % (ref 0.0–0.2)

## 2021-06-29 LAB — BASIC METABOLIC PANEL
Anion gap: 7 (ref 5–15)
BUN: 8 mg/dL (ref 6–20)
CO2: 25 mmol/L (ref 22–32)
Calcium: 8.6 mg/dL — ABNORMAL LOW (ref 8.9–10.3)
Chloride: 106 mmol/L (ref 98–111)
Creatinine, Ser: 0.92 mg/dL (ref 0.61–1.24)
GFR, Estimated: >60 mL/min (ref >60)
Glucose, Bld: 96 mg/dL (ref 70–99)
Potassium: 3.5 mmol/L (ref 3.5–5.1)
Sodium: 138 mmol/L (ref 135–145)

## 2021-06-29 MED ORDER — KETOROLAC TROMETHAMINE 30 MG/ML IJ SOLN
30.0000 mg | Freq: Once | INTRAMUSCULAR | Status: AC
  Administered 2021-06-29: 30 mg via INTRAMUSCULAR
  Filled 2021-06-29: qty 1

## 2021-06-29 MED ORDER — PREDNISONE 10 MG PO TABS: 10.0000 mg | ORAL_TABLET | Freq: Every day | ORAL | 0 refills | Status: DC

## 2021-06-29 MED ORDER — CYCLOBENZAPRINE HCL 5 MG PO TABS: 5.0000 mg | ORAL_TABLET | Freq: Three times a day (TID) | ORAL | 0 refills | Status: AC | PRN

## 2021-06-29 NOTE — ED Provider Notes (Addendum)
[facility] Provider Note    Event Date/Time   First MD Initiated Contact with Patient 06/29/21 1600     (approximate)   History   Flank Pain   HPI  Joe Adkins is a 56 y.o. male history of kidney stones presents to the ER for evaluation of right flank pain radiating to his right groin for the past 5 days.  No associated fevers.  No dysuria.  Feels different than previous kidney stones as he says that he is usually able to pass them fairly quickly.  States the pain is mild to moderate in severity.  Denies any chest pain or pressure.     Physical Exam   Triage Vital Signs: ED Triage Vitals  Enc Vitals Group     BP 06/29/21 1502 (!) 140/111     Pulse Rate 06/29/21 1502 100     Resp 06/29/21 1502 19     Temp 06/29/21 1502 98 F (36.7 C)     Temp src --      SpO2 06/29/21 1502 96 %     Weight --      Height --      Head Circumference --      Peak Flow --      Pain Score 06/29/21 1500 10     Pain Loc --      Pain Edu? --      Excl. in GC? --     Most recent vital signs: Vitals:   06/29/21 1502  BP: (!) 140/111  Pulse: 100  Resp: 19  Temp: 98 F (36.7 C)  SpO2: 96%     Constitutional: Alert  Eyes: Conjunctivae are normal.  Head: Atraumatic. Nose: No congestion/rhinnorhea. Mouth/Throat: Mucous membranes are moist.   Neck: Painless ROM.  Cardiovascular:   Good peripheral circulation. Respiratory: Normal respiratory effort.  No retractions.  Gastrointestinal: Soft and nontender.  Musculoskeletal:  no deformity Neurologic:  MAE spontaneously. No gross focal neurologic deficits are appreciated.  Skin:  Skin is warm, dry and intact. No rash noted. Psychiatric: Mood and affect are normal. Speech and behavior are normal.    ED Results / Procedures / Treatments   Labs (all labs ordered are listed, but only abnormal results are displayed) Labs Reviewed  URINALYSIS, ROUTINE W REFLEX MICROSCOPIC - Abnormal; Notable for the following  components:      Result Value   Color, Urine YELLOW (*)    APPearance CLEAR (*)    Hgb urine dipstick MODERATE (*)    Bacteria, UA RARE (*)    All other components within normal limits  CBC - Abnormal; Notable for the following components:   WBC 11.3 (*)    All other components within normal limits  BASIC METABOLIC PANEL - Abnormal; Notable for the following components:   Calcium 8.6 (*)    All other components within normal limits     EKG     RADIOLOGY Please see ED Course for my review and interpretation.  I personally reviewed all radiographic images ordered to evaluate for the above acute complaints and reviewed radiology reports and findings.  These findings were personally discussed with the patient.  Please see medical record for radiology report.    PROCEDURES:  Critical Care performed:   Procedures   MEDICATIONS ORDERED IN ED: Medications  ketorolac (TORADOL) 30 MG/ML injection 30 mg (30 mg Intramuscular Given 06/29/21 1702)     IMPRESSION / MDM / ASSESSMENT AND PLAN / ED COURSE  I  reviewed the triage vital signs and the nursing notes.                              Differential diagnosis includes, but is not limited to, stone, musculoskeletal strain, appendicitis, colitis, shingles  Patient presented to the ER for evaluation of right flank pain abdominal exam as described above.  History of kidney stones.  Suspicious for same.  Will order blood work as well as CT imaging to evaluate for the above differential.  He does have a mild leukocytosis but patient endorses taking a few prednisone over the past few days for the pain.  I have a lower suspicion for appendicitis.  Patient declining any pain medication at this time.   Clinical Course as of 06/29/21 1745  Mon Jun 29, 2021  1645 CT imaging on my interpretation does not show any evidence of hydro.  Will await formal radiology report. [PR]  1657 Patient reassessed.  CT imaging reviewed no sign of stone.   Appendix is normal.  Repeat abdominal exam is benign.  Seems to be having more pain radiating from his flank.  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  Month possible musculoskeletal strain we will give Toradol and reassess. [PR]  1743 Patient feels significantly improved after Toradol.  He is able to ambulate with steady gait feels like this is similar to when he had injured his back a year or 2 ago.  Requesting a course of prednisone which has helped him in the past.  Given his otherwise reassuring work-up and as I do suspect musculoskeletal injury I think it is reasonable.  We discussed conservative management and signs and symptoms for which she should return to the ER. [PR]    Clinical Course User Index [PR] Willy Eddy, MD     FINAL CLINICAL IMPRESSION(S) / ED DIAGNOSES   Final diagnoses:  Acute right flank pain     Rx / DC Orders   ED Discharge Orders          Ordered    cyclobenzaprine (FLEXERIL) 5 MG tablet  3 times daily PRN        06/29/21 1743    predniSONE (DELTASONE) 10 MG tablet  Daily        06/29/21 1743             Note:  This document was prepared using Dragon voice recognition software and may include unintentional dictation errors.    Willy Eddy, MD 06/29/21 [phone]

## 2021-06-29 NOTE — ED Triage Notes (Signed)
Pt comes with c/o right sided flank pain for 3 weeks. Pt states pain radiates around to front lower. Pt states he has taken presidone with some relief. Pt denies any known injuries. Pt denies any pain with urination.

## 2021-06-29 NOTE — Discharge Instructions (Signed)

## 2021-08-04 ENCOUNTER — Ambulatory Visit: Payer: BC Managed Care – PPO | Admitting: Nurse Practitioner

## 2021-08-12 ENCOUNTER — Other Ambulatory Visit: Payer: Self-pay | Admitting: Sports Medicine

## 2021-08-12 DIAGNOSIS — M4125 Other idiopathic scoliosis, thoracolumbar region: Secondary | ICD-10-CM

## 2021-08-12 DIAGNOSIS — M5137 Other intervertebral disc degeneration, lumbosacral region: Secondary | ICD-10-CM

## 2021-08-12 DIAGNOSIS — M51379 Other intervertebral disc degeneration, lumbosacral region without mention of lumbar back pain or lower extremity pain: Secondary | ICD-10-CM

## 2021-08-12 DIAGNOSIS — M5134 Other intervertebral disc degeneration, thoracic region: Secondary | ICD-10-CM

## 2021-08-12 DIAGNOSIS — M5136 Other intervertebral disc degeneration, lumbar region: Secondary | ICD-10-CM

## 2021-08-12 DIAGNOSIS — M5135 Other intervertebral disc degeneration, thoracolumbar region: Secondary | ICD-10-CM

## 2021-08-12 DIAGNOSIS — M51369 Other intervertebral disc degeneration, lumbar region without mention of lumbar back pain or lower extremity pain: Secondary | ICD-10-CM

## 2021-08-12 DIAGNOSIS — G8929 Other chronic pain: Secondary | ICD-10-CM

## 2021-08-12 DIAGNOSIS — M47816 Spondylosis without myelopathy or radiculopathy, lumbar region: Secondary | ICD-10-CM

## 2021-08-14 ENCOUNTER — Ambulatory Visit
Admission: RE | Admit: 2021-08-14 | Discharge: 2021-08-14 | Disposition: A | Discharge status: Home / Self Care | Payer: BC Managed Care – PPO | Source: Ambulatory Visit | Attending: Sports Medicine | Admitting: Sports Medicine

## 2021-08-14 DIAGNOSIS — M5136 Other intervertebral disc degeneration, lumbar region: Secondary | ICD-10-CM | POA: Insufficient documentation

## 2021-08-14 DIAGNOSIS — M5135 Other intervertebral disc degeneration, thoracolumbar region: Secondary | ICD-10-CM | POA: Diagnosis present

## 2021-08-14 DIAGNOSIS — G8929 Other chronic pain: Secondary | ICD-10-CM

## 2021-08-14 DIAGNOSIS — M47816 Spondylosis without myelopathy or radiculopathy, lumbar region: Secondary | ICD-10-CM

## 2021-08-14 DIAGNOSIS — M51379 Other intervertebral disc degeneration, lumbosacral region without mention of lumbar back pain or lower extremity pain: Secondary | ICD-10-CM

## 2021-08-14 DIAGNOSIS — M51369 Other intervertebral disc degeneration, lumbar region without mention of lumbar back pain or lower extremity pain: Secondary | ICD-10-CM

## 2021-08-14 DIAGNOSIS — M5441 Lumbago with sciatica, right side: Secondary | ICD-10-CM | POA: Diagnosis present

## 2021-08-14 DIAGNOSIS — M5134 Other intervertebral disc degeneration, thoracic region: Secondary | ICD-10-CM | POA: Diagnosis present

## 2021-08-14 DIAGNOSIS — M4125 Other idiopathic scoliosis, thoracolumbar region: Secondary | ICD-10-CM

## 2021-08-14 DIAGNOSIS — M5137 Other intervertebral disc degeneration, lumbosacral region: Secondary | ICD-10-CM | POA: Diagnosis present

## 2021-08-27 ENCOUNTER — Ambulatory Visit: Payer: BC Managed Care – PPO | Admitting: Urology

## 2021-08-27 ENCOUNTER — Encounter: Payer: Self-pay | Admitting: Urology

## 2021-08-27 VITALS — BP 155/92 | HR 103 | Ht 69.0 in | Wt 260.0 lb

## 2021-08-27 DIAGNOSIS — Z87442 Personal history of urinary calculi: Secondary | ICD-10-CM

## 2021-08-27 DIAGNOSIS — R3129 Other microscopic hematuria: Secondary | ICD-10-CM

## 2021-08-27 DIAGNOSIS — R109 Unspecified abdominal pain: Secondary | ICD-10-CM | POA: Diagnosis not present

## 2021-08-27 DIAGNOSIS — R319 Hematuria, unspecified: Secondary | ICD-10-CM

## 2021-08-27 LAB — URINALYSIS, COMPLETE
Bilirubin, UA: NEGATIVE
Glucose, UA: NEGATIVE
Ketones, UA: NEGATIVE
Leukocytes,UA: NEGATIVE
Nitrite, UA: NEGATIVE
Protein,UA: NEGATIVE
Specific Gravity, UA: <1.005 — ABNORMAL LOW (ref 1.005–1.030)
Urobilinogen, Ur: 0.2 mg/dL (ref 0.2–1.0)
pH, UA: 6 (ref 5.0–7.5)

## 2021-08-27 LAB — MICROSCOPIC EXAMINATION: Bacteria, UA: NONE SEEN

## 2021-08-28 ENCOUNTER — Encounter: Payer: Self-pay | Admitting: Urology

## 2021-08-28 NOTE — Progress Notes (Signed)
08/27/2021 7:55 AM   Roth Bump 09-02-1965 [id number]  Referring provider: Barbette Reichmann, MD [address] [facility] [place],  [place] [address]  Chief Complaint  Patient presents with   Hematuria    HPI: Joe Adkins is a 56 y.o. male referred for evaluation of right flank pain and microhematuria.  Prior history of stone disease and his symptoms typically consist of right flank pain which radiates to the right lower quadrant.  He has had previous episodes evaluated that were suspicious for stone and states he has also had CTs which showed a stone.  He had similar onset of right flank pain radiating to the right lower quadrant in late May 2022.  Symptoms improved however 1 week later returned and were severe enough that he went to the ED Seen [facility] ED 06/29/2021.  Urinalysis showed 11-20 RBC.  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 Marcello Fennel showed persistent microhematuria at 4-10 RBC Denies gross hematuria His flank pain has improved but still present   PMH: Past Medical History:  Diagnosis Date   Asthma    Kidney stone    OA (osteoarthritis)     Surgical History: Past Surgical History:  Procedure Laterality Date   NERVE BIOPSY      Home Medications:  Allergies as of 08/27/2021       Reactions   Bee Venom Anaphylaxis   Penicillins Anaphylaxis        Medication List        Accurate as of August 27, 2021 11:59 PM. If you have any questions, ask your nurse or doctor.          STOP taking these medications    doxycycline 100 MG capsule Commonly known as: MONODOX Stopped by: Riki Altes, MD   Fluticasone-Salmeterol 250-50 MCG/DOSE Aepb Commonly known as: ADVAIR Stopped by: Riki Altes,  MD   predniSONE 10 MG tablet Commonly known as: DELTASONE Stopped by: Riki Altes, MD       TAKE these medications    albuterol 108 (90 Base) MCG/ACT inhaler Commonly known as: Ventolin HFA Inhale 2 puffs into the lungs every 4 (four) hours as needed for wheezing or shortness of breath.   cyclobenzaprine 5 MG tablet Commonly known as: FLEXERIL Take 1 tablet (5 mg total) by mouth 3 (three) times daily as needed for muscle spasms.   naproxen sodium 220 MG tablet Commonly known as: ALEVE Take 220 mg by mouth 2 (two) times daily with a meal.        Allergies:  Allergies  Allergen Reactions   Bee Venom Anaphylaxis   Penicillins Anaphylaxis    Family History: Family History  Problem Relation Age of Onset   Arthritis Mother    Lung cancer Father    Arthritis Maternal Grandmother     Social History:  reports that he has never smoked. He has never used smokeless tobacco. He reports that he does not drink alcohol and does not use drugs.   Physical Exam: BP (!) 155/92   Pulse (!) 103   Ht 5\' 9"  (1.753 m)   Wt 260 lb (117.9 kg)   BMI 38.40 kg/m   Constitutional:  Alert and oriented, No acute distress. HEENT: [place] AT Respiratory: Normal respiratory effort, no increased  work of breathing. Skin: No rashes, bruises or suspicious lesions. Neurologic: Grossly intact, no focal deficits, moving all 4 extremities. Psychiatric: Normal mood and affect.  Laboratory Data:  Urinalysis Microscopy 3-10 RBC   Pertinent Imaging: CT images were personally reviewed and interpreted  CT Renal Stone Study  Narrative CLINICAL DATA:  Flank pain, kidney stone suspected  EXAM: CT ABDOMEN AND PELVIS WITHOUT CONTRAST  TECHNIQUE: Multidetector CT imaging of the abdomen and pelvis was performed following the standard protocol without IV contrast.  RADIATION DOSE REDUCTION: This exam was performed according to the departmental dose-optimization program which includes  automated exposure control, adjustment of the mA and/or kV according to patient size and/or use of iterative reconstruction technique.  COMPARISON:  None Available.  FINDINGS: Evaluation is limited by lack of IV contrast.  Lower chest: No acute abnormality.  Hepatobiliary: Unremarkable noncontrast appearance of the liver and gallbladder.  Pancreas: No peripancreatic fat stranding.  Spleen: Unremarkable.  Adrenals/Urinary Tract: Adrenal glands are unremarkable. No hydronephrosis. No obstructing nephrolithiasis. Bladder is unremarkable.  Stomach/Bowel: No evidence of bowel obstruction. Diverticulosis without evidence of acute diverticulitis. Appendix is normal. Stomach is unremarkable.  Vascular/Lymphatic: No significant vascular findings are present. No enlarged abdominal or pelvic lymph nodes.  Reproductive: Prostate is unremarkable.  Other: No abdominal wall hernia or abnormality. No abdominopelvic ascites.  Musculoskeletal: Degenerative changes of the lumbar spine. This is most pronounced at L2-3 where there is at least moderate canal narrowing.  IMPRESSION: 1. No CT etiology for acute abdominal pain is identified.   Electronically Signed By: Meda Klinefelter M.D. On: 06/29/2021 16:46   Assessment & Plan:    1.  Microhematuria Prior history of stone disease with recent stone protocol CT showing no evidence of urinary tract calcification He has persistent microhematuria and recommend further evaluation with CTU and cystoscopy  2.  Right flank pain Etiology undetermined.  Noted to have multilevel degenerative disc disease  3.  Personal history urinary calculi    Riki Altes, MD  [facility] [address], [address] [place], [place] [address] [phone]

## 2021-09-02 ENCOUNTER — Ambulatory Visit
Admission: RE | Admit: 2021-09-02 | Discharge: 2021-09-02 | Disposition: A | Discharge status: Home / Self Care | Payer: BC Managed Care – PPO | Source: Ambulatory Visit | Attending: Urology | Admitting: Urology

## 2021-09-02 DIAGNOSIS — R3129 Other microscopic hematuria: Secondary | ICD-10-CM | POA: Diagnosis present

## 2021-09-02 DIAGNOSIS — R109 Unspecified abdominal pain: Secondary | ICD-10-CM | POA: Diagnosis present

## 2021-09-02 MED ORDER — IOHEXOL 300 MG/ML  SOLN
125.0000 mL | Freq: Once | INTRAMUSCULAR | Status: AC | PRN
  Administered 2021-09-02: 125 mL via INTRAVENOUS

## 2021-09-03 ENCOUNTER — Encounter: Payer: Self-pay | Admitting: Urology

## 2021-09-18 ENCOUNTER — Other Ambulatory Visit: Payer: BC Managed Care – PPO | Admitting: Urology
